# Patient Record
Sex: Female | Born: 1980 | Race: White | Hispanic: No | Marital: Married | Smoking: Light tobacco smoker
Health system: Southern US, Community
[De-identification: ages and names within clinical notes are randomized; demographics above are authoritative.]

## PROBLEM LIST (undated history)

## (undated) HISTORY — PX: PERIPHERAL VASCULAR THROMBECTOMY: CATH118306

## (undated) HISTORY — PX: TONSILLECTOMY: SUR1361

---

## 2004-04-22 ENCOUNTER — Emergency Department: Payer: Self-pay | Admitting: Emergency Medicine

## 2006-11-19 ENCOUNTER — Emergency Department (HOSPITAL_COMMUNITY): Admission: EM | Admit: 2006-11-19 | Discharge: 2006-11-19 | Payer: Self-pay | Admitting: Emergency Medicine

## 2008-05-04 ENCOUNTER — Encounter: Payer: Self-pay | Admitting: Maternal and Fetal Medicine

## 2008-05-14 ENCOUNTER — Encounter: Payer: Self-pay | Admitting: Obstetrics and Gynecology

## 2008-05-21 ENCOUNTER — Encounter: Payer: Self-pay | Admitting: Maternal & Fetal Medicine

## 2008-05-28 ENCOUNTER — Encounter: Payer: Self-pay | Admitting: Obstetrics and Gynecology

## 2008-07-09 ENCOUNTER — Encounter: Payer: Self-pay | Admitting: Maternal & Fetal Medicine

## 2008-07-30 ENCOUNTER — Encounter: Payer: Self-pay | Admitting: Maternal & Fetal Medicine

## 2008-08-03 ENCOUNTER — Encounter: Payer: Self-pay | Admitting: Obstetrics and Gynecology

## 2008-09-24 ENCOUNTER — Encounter: Payer: Self-pay | Admitting: Maternal & Fetal Medicine

## 2008-10-10 ENCOUNTER — Inpatient Hospital Stay: Payer: Self-pay

## 2008-10-22 ENCOUNTER — Encounter: Payer: Self-pay | Admitting: Maternal & Fetal Medicine

## 2013-11-06 DIAGNOSIS — F419 Anxiety disorder, unspecified: Secondary | ICD-10-CM | POA: Insufficient documentation

## 2018-07-30 ENCOUNTER — Other Ambulatory Visit: Payer: Self-pay

## 2018-07-30 ENCOUNTER — Emergency Department
Admission: EM | Admit: 2018-07-30 | Discharge: 2018-07-30 | Disposition: A | Discharge status: Home / Self Care | Payer: BLUE CROSS/BLUE SHIELD | Attending: Emergency Medicine | Admitting: Emergency Medicine

## 2018-07-30 ENCOUNTER — Emergency Department: Payer: BLUE CROSS/BLUE SHIELD

## 2018-07-30 DIAGNOSIS — S8992XA Unspecified injury of left lower leg, initial encounter: Secondary | ICD-10-CM | POA: Diagnosis not present

## 2018-07-30 DIAGNOSIS — F17200 Nicotine dependence, unspecified, uncomplicated: Secondary | ICD-10-CM | POA: Diagnosis not present

## 2018-07-30 DIAGNOSIS — Z79899 Other long term (current) drug therapy: Secondary | ICD-10-CM | POA: Diagnosis not present

## 2018-07-30 DIAGNOSIS — Y929 Unspecified place or not applicable: Secondary | ICD-10-CM | POA: Diagnosis not present

## 2018-07-30 DIAGNOSIS — X501XXA Overexertion from prolonged static or awkward postures, initial encounter: Secondary | ICD-10-CM | POA: Insufficient documentation

## 2018-07-30 DIAGNOSIS — Y9301 Activity, walking, marching and hiking: Secondary | ICD-10-CM | POA: Diagnosis not present

## 2018-07-30 DIAGNOSIS — Y999 Unspecified external cause status: Secondary | ICD-10-CM | POA: Insufficient documentation

## 2018-07-30 MED ORDER — KETOROLAC TROMETHAMINE 30 MG/ML IJ SOLN
30.0000 mg | Freq: Once | INTRAMUSCULAR | Status: AC
  Administered 2018-07-30: 11:00:00 30 mg via INTRAMUSCULAR
  Filled 2018-07-30: qty 1

## 2018-07-30 MED ORDER — IBUPROFEN 400 MG PO TABS: 400.0000 mg | ORAL_TABLET | Freq: Four times a day (QID) | ORAL | 0 refills | Status: AC | PRN

## 2018-07-30 NOTE — Discharge Instructions (Addendum)
Your x-ray does not show any fractures of your knee.  Please keep your knee Ace wrap or wear a knee brace that you can find at [facility] or [facility].  Use crutches and limit amount of pressure you put on your knee for a couple of days.  Ice and elevate knee today.  You can take ibuprofen for inflammation.  Follow-up with primary care or orthopedics for reevaluation.

## 2018-07-30 NOTE — ED Provider Notes (Signed)
[facility] Emergency Department Provider Note  ____________________________________________  Time seen: Approximately 10:40 AM  I have reviewed the triage vital signs and the nursing notes.   HISTORY  Chief Complaint Knee Pain    HPI Brooke Wright is a 38 y.o. female that presents to the emergency department for evaluation of left knee pain after injury last night.  Patient states that she twisted her ankle from slipping on a puddle of water.  She did not fall.  She did not hear a pop.  Pain is to both sides of her knee.  No additional injuries.  She took ibuprofen last night for pain but nothing today.   History reviewed. No pertinent past medical history.  There are no active problems to display for this patient.   Past Surgical History:  Procedure Laterality Date  . PERIPHERAL VASCULAR THROMBECTOMY    . TONSILLECTOMY      Prior to Admission medications   Medication Sig Start Date End Date Taking? Authorizing Provider  ALPRAZolam (XANAX) 0.25 MG tablet Take 0.25 mg by mouth 2 (two) times daily as needed for anxiety.   Yes [provider]  citalopram (CELEXA) 20 MG tablet Take 20 mg by mouth daily.   Yes [provider]  mirtazapine (REMERON) 30 MG tablet Take 30 mg by mouth at bedtime.   Yes [provider]  ibuprofen (ADVIL) 400 MG tablet Take 1 tablet (400 mg total) by mouth every 6 (six) hours as needed. 07/30/18   Enid Derry, PA-C    Allergies Penicillins  No family history on file.  Social History Social History   Tobacco Use  . Smoking status: Current Every Day Smoker  . Smokeless tobacco: Never Used  Substance Use Topics  . Alcohol use: Not Currently  . Drug use: Not Currently     Review of Systems  Cardiovascular: No chest pain. Respiratory: No SOB. Gastrointestinal:  No nausea, no vomiting.  Musculoskeletal: Positive for knee pain.   Skin: Negative for rash, abrasions, lacerations,  ecchymosis. Neurological: Negative for numbness or tingling   ____________________________________________   PHYSICAL EXAM:  VITAL SIGNS: ED Triage Vitals  Enc Vitals Group     BP 07/30/18 0938 134/82     Pulse Rate 07/30/18 0938 79     Resp 07/30/18 0938 17     Temp 07/30/18 0938 98.3 F (36.8 C)     Temp Source 07/30/18 0938 Oral     SpO2 07/30/18 0938 99 %     Weight 07/30/18 0939 180 lb (81.6 kg)     Height 07/30/18 0939 5\' 6"  (1.676 m)     Head Circumference --      Peak Flow --      Pain Score 07/30/18 0938 8     Pain Loc --      Pain Edu? --      Excl. in GC? --      Constitutional: Alert and oriented. Well appearing and in no acute distress. Eyes: Conjunctivae are normal. PERRL. EOMI. Head: Atraumatic. ENT:      Ears:      Nose: No congestion/rhinnorhea.      Mouth/Throat: Mucous membranes are moist.  Neck: No stridor.  Cardiovascular: Normal rate, regular rhythm.  Good peripheral circulation. Respiratory: Normal respiratory effort without tachypnea or retractions. Lungs CTAB. Good air entry to the bases with no decreased or absent breath sounds. Musculoskeletal: Full range of motion to all extremities. No gross deformities appreciated. No tenderness to palpation. No effusion  noted. Negative anterior drawer, posterior drawer, valgus, varus, mcMurray, patella apprehension, apley grind. Neurologic:  Normal speech and language. No gross focal neurologic deficits are appreciated.  Skin:  Skin is warm, dry and intact. No rash noted. Psychiatric: Mood and affect are normal. Speech and behavior are normal. Patient exhibits appropriate insight and judgement.   ____________________________________________   LABS (all labs ordered are listed, but only abnormal results are displayed)  Labs Reviewed - No data to display ____________________________________________  EKG   ____________________________________________  RADIOLOGY Lexine BatonI, Kailan Laws, personally viewed  and evaluated these images (plain radiographs) as part of my medical decision making, as well as reviewing the written report by the radiologist.  Dg Knee Complete 4 Views Left  Result Date: 07/30/2018 CLINICAL DATA:  38 year old female status post fall last night. EXAM: LEFT KNEE - COMPLETE 4+ VIEW COMPARISON:  None. FINDINGS: Bone mineralization is within normal limits. No evidence of fracture, dislocation, or joint effusion. Mild medial compartment spurring and subchondral sclerosis. No discrete soft tissue injury. IMPRESSION: Mild degenerative changes. No acute fracture or dislocation identified about the left knee. Electronically Signed   By: Odessa FlemingH  Hall M.D.   On: 07/30/2018 10:05    ____________________________________________    PROCEDURES  Procedure(s) performed:    Procedures    Medications  ketorolac (TORADOL) 30 MG/ML injection 30 mg (30 mg Intramuscular Given 07/30/18 1055)     ____________________________________________   INITIAL IMPRESSION / ASSESSMENT AND PLAN / ED COURSE  Pertinent labs & imaging results that were available during my care of the patient were reviewed by me and considered in my medical decision making (see chart for details).  Review of the [place] CSRS was performed in accordance of the [facility] prior to dispensing any controlled drugs.   Patient's diagnosis is consistent with knee injury.  Vital signs and exam are reassuring.  X-ray negative for acute bony abnormalities.  Knee was Ace wrapped.  Crutches were given.  Patient was given IM Toradol for pain.  Patient will be discharged home with prescriptions for Motrin. Patient is to follow up with primary care as directed. Patient is given ED precautions to return to the ED for any worsening or new symptoms.     ____________________________________________  FINAL CLINICAL IMPRESSION(S) / ED DIAGNOSES  Final diagnoses:  Injury of left knee, initial encounter      NEW MEDICATIONS STARTED DURING THIS  VISIT:  ED Discharge Orders         Ordered    ibuprofen (ADVIL) 400 MG tablet  Every 6 hours PRN     07/30/18 1115              This chart was dictated using voice recognition software/Dragon. Despite best efforts to proofread, errors can occur which can change the meaning. Any change was purely unintentional.    Enid DerryWagner, Delta Deshmukh, PA-C 07/30/18 1558    Minna AntisPaduchowski, Kevin, MD 07/31/18 [phone]

## 2018-07-30 NOTE — ED Triage Notes (Signed)
Pt states she slipped and fell last night injuring her left knee.

## 2018-08-04 ENCOUNTER — Emergency Department: Payer: BLUE CROSS/BLUE SHIELD

## 2018-08-04 ENCOUNTER — Encounter: Payer: Self-pay | Admitting: Emergency Medicine

## 2018-08-04 ENCOUNTER — Emergency Department
Admission: EM | Admit: 2018-08-04 | Discharge: 2018-08-04 | Disposition: A | Discharge status: Home / Self Care | Payer: BLUE CROSS/BLUE SHIELD | Attending: Emergency Medicine | Admitting: Emergency Medicine

## 2018-08-04 ENCOUNTER — Other Ambulatory Visit: Payer: Self-pay

## 2018-08-04 DIAGNOSIS — Z79899 Other long term (current) drug therapy: Secondary | ICD-10-CM | POA: Diagnosis not present

## 2018-08-04 DIAGNOSIS — M25561 Pain in right knee: Secondary | ICD-10-CM | POA: Diagnosis present

## 2018-08-04 DIAGNOSIS — W010XXD Fall on same level from slipping, tripping and stumbling without subsequent striking against object, subsequent encounter: Secondary | ICD-10-CM | POA: Diagnosis not present

## 2018-08-04 DIAGNOSIS — S86911D Strain of unspecified muscle(s) and tendon(s) at lower leg level, right leg, subsequent encounter: Secondary | ICD-10-CM | POA: Diagnosis not present

## 2018-08-04 DIAGNOSIS — R2241 Localized swelling, mass and lump, right lower limb: Secondary | ICD-10-CM | POA: Insufficient documentation

## 2018-08-04 DIAGNOSIS — F17200 Nicotine dependence, unspecified, uncomplicated: Secondary | ICD-10-CM | POA: Insufficient documentation

## 2018-08-04 DIAGNOSIS — M7989 Other specified soft tissue disorders: Secondary | ICD-10-CM

## 2018-08-04 MED ORDER — TRAMADOL HCL 50 MG PO TABS
50.0000 mg | ORAL_TABLET | Freq: Once | ORAL | Status: AC
  Administered 2018-08-04: 50 mg via ORAL
  Filled 2018-08-04: qty 1

## 2018-08-04 NOTE — Discharge Instructions (Addendum)
Your exam and [place] are reassuring at this time. There is no evidence of a blood clot in the lower leg. You swelling was likely due to constriction from your ace bandage. Wear the knee immobilizer as needed for support. Rest with the leg elevated to reduce swelling. Take the ibuprofen as needed. Follow-up with ortho for ongoing symptoms.

## 2018-08-04 NOTE — ED Notes (Signed)
US at bedside

## 2018-08-04 NOTE — ED Triage Notes (Signed)
Pt to ED via POV stating "I need an MRI". Pt states that she was seen on Tuesday because she hurt her left knee. Pt reports that since then the pain and swelling has gotten worse. Pt is in NAD.

## 2018-08-04 NOTE — ED Notes (Signed)
PA at bedside.

## 2018-08-04 NOTE — ED Provider Notes (Signed)
[facility] Emergency Department Provider Note ____________________________________________  Time seen: 1207  I have reviewed the triage vital signs and the nursing notes.  HISTORY  Chief Complaint  Knee Pain  HPI Brooke Wright is a 38 y.o. female parents to the ED for subsequent visit following her left knee sprain.  Patient was placed in Ace bandage and given crutches to ambulate following an initial evaluation.  She had a mechanical twisting injury to the left knee when she slipped a wet floor.  Patient's x-ray was negative on the initial evaluation.  She returns today, citing some swelling from the knee to the ankle.  She denies any reinjury in the interim.  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  She denies any chest pain, shortness of breath, diaphoresis, nausea, vomiting, or dizziness.  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  She is not currently on any anticoagulation therapy.  History reviewed. No pertinent past medical history.  There are no active problems to display for this patient.   Past Surgical History:  Procedure Laterality Date  . PERIPHERAL VASCULAR THROMBECTOMY    . TONSILLECTOMY      Prior to Admission medications   Medication Sig Start Date End Date Taking? Authorizing Provider  ALPRAZolam (XANAX) 0.25 MG tablet Take 0.25 mg by mouth 2 (two) times daily as needed for anxiety.    [provider]  citalopram (CELEXA) 20 MG tablet Take 20 mg by mouth daily.    [provider]  ibuprofen (ADVIL) 400 MG tablet Take 1 tablet (400 mg total) by mouth every 6 (six) hours as needed. 07/30/18   Enid Derry, PA-C  mirtazapine (REMERON) 30 MG tablet Take 30 mg by mouth at bedtime.    [provider]    Allergies Penicillins  No family history on file.  Social History Social History    Tobacco Use  . Smoking status: Current Every Day Smoker  . Smokeless tobacco: Never Used  Substance Use Topics  . Alcohol use: Not Currently  . Drug use: Not Currently    Review of Systems  Constitutional: Negative for fever. Eyes: Negative for visual changes. ENT: Negative for sore throat. Cardiovascular: Negative for chest pain. Respiratory: Negative for shortness of breath. Gastrointestinal: Negative for abdominal pain, vomiting and diarrhea. Genitourinary: Negative for dysuria. Musculoskeletal: Negative for back pain.  Left leg pain as above. Skin: Negative for rash. Neurological: Negative for headaches, focal weakness or numbness. ____________________________________________  PHYSICAL EXAM:  VITAL SIGNS: ED Triage Vitals  Enc Vitals Group     BP 08/04/18 1115 113/61     Pulse Rate 08/04/18 1115 94     Resp 08/04/18 1115 16     Temp 08/04/18 1115 97.6 F (36.4 C)     Temp Source 08/04/18 1115 Oral     SpO2 08/04/18 1115 97 %     Weight --      Height --      Head Circumference --      Peak Flow --      Pain Score 08/04/18 1114 9     Pain Loc --      Pain Edu? --      Excl. in GC? --     Constitutional: Alert and oriented. Well appearing and in no distress. Head: Normocephalic and atraumatic. Eyes: Conjunctivae are normal. Normal extraocular movements  Cardiovascular: Normal rate, regular rhythm. Normal distal pulses and cap refill distally. Respiratory: Normal respiratory effort.  Musculoskeletal: left knee no obvious deformity, dislocation, or joint effusion.  Patient with normal flexion extension range of the left knee without crepitus.  No valgus or varus joint stress is elicited.  No popliteal space fullness or Achilles tenderness distally.  Patient is noted to have some 1+ pitting edema from the proximal shin to the ankle.  No palpable cords or muscle defect in the gastroc musculature.  Ankle exam is benign distally.  Nontender with normal range of motion  in all extremities.  Neurologic:  Normal gross sensation. Normal speech and language. No gross focal neurologic deficits are appreciated. Skin:  Skin is warm, dry and intact. No rash noted.  No bruising, ecchymosis, skin mottling, cool, red, or erythematous skin noted. Psychiatric: Mood and affect are normal. Patient exhibits appropriate insight and judgment. ____________________________________________   RADIOLOGY  Venous US LLE negative ____________________________________________  PROCEDURES  Procedures Ultram 50 mg PO ____________________________________________  INITIAL IMPRESSION / ASSESSMENT AND PLAN / ED COURSE  Jalene MulletCourtney Bonnet was evaluated in Emergency Department on 08/06/2018 for the symptoms described in the history of present illness. She was evaluated in the context of the global COVID-19 pandemic, which necessitated consideration that the patient might be at risk for infection with the SARS-CoV-2 virus that causes COVID-19. Institutional protocols and algorithms that pertain to the evaluation of patients at risk for COVID-19 are in a state of rapid change based on information released by regulatory bodies including the CDC and federal and state organizations. These policies and algorithms were followed during the patient's care in the ED.  Patient with ED evaluation of swelling to the left leg following a knee sprain.  Patient had been wearing an Ace bandage around the knee joint while working, which likely caused some dependent edema to the lower extremity.  Her DVT study is negative at this time and reassuring for any acute occlusive process.  Symptoms likely represent some dependent edema secondary to constriction with Ace bandage.  Patient will be discharged with a knee immobilizer and instructions to bear weight as tolerated.  She is advised to rest, ice, and elevate the knee for comfort.  Again referred to orthopedics for further evaluation management of her underlying  knee sprain.   _______________________  FINAL CLINICAL IMPRESSION(S) / ED DIAGNOSES  Final diagnoses:  Strain of right knee, subsequent encounter  Swelling of lower leg      Lissa HoardMenshew, Marybeth Dandy V Bacon, PA-C 08/06/18 0758    Dionne BucySiadecki, Sebastian, MD 08/11/18 [phone]

## 2020-01-13 ENCOUNTER — Other Ambulatory Visit: Payer: Self-pay

## 2020-01-13 ENCOUNTER — Other Ambulatory Visit (HOSPITAL_COMMUNITY)
Admission: RE | Admit: 2020-01-13 | Discharge: 2020-01-13 | Disposition: A | Discharge status: Home / Self Care | Payer: BLUE CROSS/BLUE SHIELD | Source: Ambulatory Visit | Attending: Advanced Practice Midwife | Admitting: Advanced Practice Midwife

## 2020-01-13 ENCOUNTER — Encounter: Payer: Self-pay | Admitting: Advanced Practice Midwife

## 2020-01-13 ENCOUNTER — Ambulatory Visit (INDEPENDENT_AMBULATORY_CARE_PROVIDER_SITE_OTHER): Payer: 59 | Admitting: Advanced Practice Midwife

## 2020-01-13 VITALS — BP 130/78 | Ht 66.0 in | Wt 170.0 lb

## 2020-01-13 DIAGNOSIS — Z01419 Encounter for gynecological examination (general) (routine) without abnormal findings: Secondary | ICD-10-CM | POA: Insufficient documentation

## 2020-01-13 DIAGNOSIS — Z124 Encounter for screening for malignant neoplasm of cervix: Secondary | ICD-10-CM | POA: Diagnosis not present

## 2020-01-13 DIAGNOSIS — N912 Amenorrhea, unspecified: Secondary | ICD-10-CM

## 2020-01-13 LAB — POCT URINE PREGNANCY: Preg Test, Ur: NEGATIVE

## 2020-01-13 NOTE — Progress Notes (Signed)
Gynecology Annual Exam   PCP: Dorothey Baseman, MD  Chief Complaint:  Chief Complaint  Patient presents with  . Gynecologic Exam    Annual exam, period stopped in August. date unknown     History of Present Illness: Patient is a 39 y.o. [id number] presents for annual exam. The patient has complaint today of no period since the beginning of August. She admits an increase in stress with the death of her mother in law at the end of August and other family stressors. We discussed peri-menopause and the role stress can play in reproductive hormone function. Pregnancy test today is negative.    LMP: Patient's last menstrual period was 10/20/2019 (approximate). Usual Average Interval: regular, 30 days Duration of flow: 4 days Heavy Menses: no Clots: no Intermenstrual Bleeding: no Postcoital Bleeding: no Dysmenorrhea: no  The patient is sexually active. She currently uses vasectomy for contraception. She denies dyspareunia.  The patient does perform self breast exams.  There is no notable family history of breast or ovarian cancer in her family.  The patient wears seatbelts: yes.   The patient has regular exercise: she walks and occasionally hikes. She likes fruits and vegetables. She also eats sugary, processed and fried foods. She gets about 8 hours of sleep per night.  She smokes 1-2 cigarettes per day for the past 1.5 years. She has an on again, off again smoking history.  The patient denies current symptoms of depression.  She has good control of moods with medication and therapy.  Review of Systems: Review of Systems  Constitutional: Negative for chills and fever.  HENT: Negative for congestion, ear discharge, ear pain, hearing loss, sinus pain and sore throat.   Eyes: Negative for blurred vision and double vision.  Respiratory: Negative for cough, shortness of breath and wheezing.   Cardiovascular: Negative for chest pain, palpitations and leg swelling.  Gastrointestinal:  Negative for abdominal pain, blood in stool, constipation, diarrhea, heartburn, melena, nausea and vomiting.  Genitourinary: Negative for dysuria, flank pain, frequency, hematuria and urgency.  Musculoskeletal: Positive for joint pain. Negative for back pain and myalgias.  Skin: Negative for itching and rash.  Neurological: Negative for dizziness, tingling, tremors, sensory change, speech change, focal weakness, seizures, loss of consciousness, weakness and headaches.  Endo/Heme/Allergies: Negative for environmental allergies. Does not bruise/bleed easily.  Psychiatric/Behavioral: Negative for depression, hallucinations, memory loss, substance abuse and suicidal ideas. The patient is not nervous/anxious and does not have insomnia.        Positive for anxiety and depression  Breast: positive for breast tenderness   Past Medical History:  Patient Active Problem List   Diagnosis Date Noted  . Anxiety 11/06/2013    Past Surgical History:  Past Surgical History:  Procedure Laterality Date  . PERIPHERAL VASCULAR THROMBECTOMY    . TONSILLECTOMY      Gynecologic History:  Patient's last menstrual period was 10/20/2019 (approximate). Contraception: vasectomy Last Pap: 4 years ago Results were: no abnormalities   Obstetric History: [id number]  Family History:  History reviewed. No pertinent family history.  Social History:  Social History   Socioeconomic History  . Marital status: Married    Spouse name: Not on file  . Number of children: Not on file  . Years of education: Not on file  . Highest education level: Not on file  Occupational History  . Not on file  Tobacco Use  . Smoking status: Light Tobacco Smoker  . Smokeless tobacco: Never Used  Vaping Use  .  Vaping Use: Never used  Substance and Sexual Activity  . Alcohol use: Yes    Comment: socially   . Drug use: Yes    Types: Marijuana  . Sexual activity: Yes    Birth control/protection: None    Comment: Husband -  sterlized   Other Topics Concern  . Not on file  Social History Narrative  . Not on file   Social Determinants of Health   Financial Resource Strain:   . Difficulty of Paying Living Expenses: Not on file  Food Insecurity:   . Worried About Programme researcher, broadcasting/film/video in the Last Year: Not on file  . Ran Out of Food in the Last Year: Not on file  Transportation Needs:   . Lack of Transportation (Medical): Not on file  . Lack of Transportation (Non-Medical): Not on file  Physical Activity:   . Days of Exercise per Week: Not on file  . Minutes of Exercise per Session: Not on file  Stress:   . Feeling of Stress : Not on file  Social Connections:   . Frequency of Communication with Friends and Family: Not on file  . Frequency of Social Gatherings with Friends and Family: Not on file  . Attends Religious Services: Not on file  . Active Member of Clubs or Organizations: Not on file  . Attends Banker Meetings: Not on file  . Marital Status: Not on file  Intimate Partner Violence:   . Fear of Current or Ex-Partner: Not on file  . Emotionally Abused: Not on file  . Physically Abused: Not on file  . Sexually Abused: Not on file    Allergies:  Allergies  Allergen Reactions  . Penicillins Anaphylaxis    Medications: Prior to Admission medications   Medication Sig Start Date End Date Taking? Authorizing Provider  ALPRAZolam (XANAX) 0.25 MG tablet Take 0.25 mg by mouth 2 (two) times daily as needed for anxiety.   Yes [provider]  citalopram (CELEXA) 20 MG tablet Take 20 mg by mouth daily.   Yes [provider]  ibuprofen (ADVIL) 400 MG tablet Take 1 tablet (400 mg total) by mouth every 6 (six) hours as needed. 07/30/18  Yes Enid Derry, PA-C  mirtazapine (REMERON) 30 MG tablet Take 30 mg by mouth at bedtime.   Yes [provider]  B Complex Vitamins (VITAMIN B COMPLEX 100 IJ) Take by mouth.    [provider]  Calcium  Carb-Cholecalciferol (CALCIUM CARBONATE-VITAMIN D3) 600-400 MG-UNIT TABS Take 2 tablets by mouth daily.    [provider]    Physical Exam Vitals: Blood pressure 130/78, height 5\' 6"  (1.676 m), weight 170 lb (77.1 kg), last menstrual period 10/20/2019.  General: NAD HEENT: normocephalic, anicteric Thyroid: no enlargement, no palpable nodules Pulmonary: No increased work of breathing, CTAB Cardiovascular: RRR, distal pulses 2+ Breast: Breast symmetrical, mild tenderness to palpation, no palpable nodules or masses, no skin or nipple retraction present, no nipple discharge.  No axillary or supraclavicular lymphadenopathy. Abdomen: NABS, soft, non-tender, non-distended.  Umbilicus without lesions.  No hepatomegaly, splenomegaly or masses palpable. No evidence of hernia  Genitourinary:  External: Normal external female genitalia.  Normal urethral meatus, normal Bartholin's and Skene's glands.    Vagina: Normal vaginal mucosa, no evidence of prolapse.    Cervix: Grossly normal in appearance, no bleeding, no CMT  Uterus: deferred for no concerns    Adnexa: deferred for no concerns  Rectal: deferred  Lymphatic: no evidence of inguinal lymphadenopathy Extremities:  no edema, erythema, or tenderness Neurologic: Grossly intact Psychiatric: mood appropriate, affect full   Assessment: 39 y.o. G2P1102 routine annual exam  Plan: Problem List Items Addressed This Visit    None    Visit Diagnoses    Well woman exam with routine gynecological exam    -  Primary   Relevant Orders   Cytology - PAP   Amenorrhea       Relevant Orders   POCT urine pregnancy (Completed)   Cervical cancer screening       Relevant Orders   Cytology - PAP      1) STI screening  was offered and declined  2)  ASCCP guidelines and rationale discussed.  Patient opts for every 5 years screening interval  3) Contraception - the patient is currently using  vasectomy.  She is happy with her current form of  contraception and plans to continue  4) Routine healthcare maintenance including cholesterol, diabetes screening discussed managed by PCP  5) Return in about 1 year (around 01/12/2021) for annual established gyn.   Tresea Mall, CNM [facility] 01/13/2020, 2:12 PM

## 2020-01-16 LAB — CYTOLOGY - PAP
Comment: NEGATIVE
Diagnosis: NEGATIVE
High risk HPV: NEGATIVE

## 2020-07-02 ENCOUNTER — Emergency Department
Admission: EM | Admit: 2020-07-02 | Discharge: 2020-07-02 | Disposition: A | Discharge status: Home / Self Care | Payer: 59 | Attending: Emergency Medicine | Admitting: Emergency Medicine

## 2020-07-02 ENCOUNTER — Other Ambulatory Visit: Payer: Self-pay

## 2020-07-02 ENCOUNTER — Encounter: Payer: Self-pay | Admitting: Emergency Medicine

## 2020-07-02 DIAGNOSIS — T50Z95A Adverse effect of other vaccines and biological substances, initial encounter: Secondary | ICD-10-CM | POA: Diagnosis not present

## 2020-07-02 DIAGNOSIS — R591 Generalized enlarged lymph nodes: Secondary | ICD-10-CM

## 2020-07-02 DIAGNOSIS — F17200 Nicotine dependence, unspecified, uncomplicated: Secondary | ICD-10-CM | POA: Diagnosis not present

## 2020-07-02 DIAGNOSIS — R590 Localized enlarged lymph nodes: Secondary | ICD-10-CM | POA: Insufficient documentation

## 2020-07-02 DIAGNOSIS — R221 Localized swelling, mass and lump, neck: Secondary | ICD-10-CM | POA: Diagnosis present

## 2020-07-02 MED ORDER — PREDNISONE 20 MG PO TABS
40.0000 mg | ORAL_TABLET | Freq: Once | ORAL | Status: AC
  Administered 2020-07-02: 40 mg via ORAL
  Filled 2020-07-02: qty 2

## 2020-07-02 MED ORDER — DIPHENHYDRAMINE HCL 25 MG PO TABS: 25.0000 mg | ORAL_TABLET | Freq: Three times a day (TID) | ORAL | 0 refills | Status: DC

## 2020-07-02 MED ORDER — PREDNISONE 20 MG PO TABS: 40.0000 mg | ORAL_TABLET | Freq: Every day | ORAL | 0 refills | Status: AC

## 2020-07-02 MED ORDER — HYDROXYZINE HCL 25 MG PO TABS
25.0000 mg | ORAL_TABLET | Freq: Once | ORAL | Status: AC
  Administered 2020-07-02: 25 mg via ORAL
  Filled 2020-07-02: qty 1

## 2020-07-02 NOTE — Discharge Instructions (Signed)
I'd recommend against receiving a second vaccine for COVID-19  Take the steroids as prescribed. The benadryl is for swelling/rash/itching. I'd recommend taking it at least once every 8 hours for 24 hours, then as needed.

## 2020-07-02 NOTE — ED Provider Notes (Signed)
[facility] Emergency Department Provider Note  ____________________________________________   Event Date/Time   First MD Initiated Contact with Patient 07/02/20 [phone]     (approximate)  I have reviewed the triage vital signs and the nursing notes.   HISTORY  Chief Complaint No chief complaint on file.    HPI Brooke Wright is a 40 y.o. female here with neck swelling after receiving COVID-vaccine yesterday.  The patient states she was in her usual state of health until she underwent COVID vaccination yesterday.  This was her first vaccine.  She states that later yesterday afternoon, she began to develop a swelling sensation in her right lower cervical neck area.  She felt a pop.  She states that the swelling has persisted and she has now developed some swelling along her left base of the neck as well.  Patient denies any difficulty speaking or swallowing.  She does feel some subjective fullness denies any other new medications or exposures.  No other complaints.  No hoarseness.  No rash        History reviewed. No pertinent past medical history.  Patient Active Problem List   Diagnosis Date Noted  . Anxiety 11/06/2013    Past Surgical History:  Procedure Laterality Date  . PERIPHERAL VASCULAR THROMBECTOMY    . TONSILLECTOMY      Prior to Admission medications   Medication Sig Start Date End Date Taking? Authorizing Provider  diphenhydrAMINE (BENADRYL) 25 MG tablet Take 1 tablet (25 mg total) by mouth in the morning, at noon, and at bedtime for 3 days. 07/02/20 07/05/20 Yes Shaune Pollack, MD  predniSONE (DELTASONE) 20 MG tablet Take 2 tablets (40 mg total) by mouth daily for 3 days. 07/02/20 07/05/20 Yes Shaune Pollack, MD  ALPRAZolam Prudy Feeler) 0.25 MG tablet Take 0.25 mg by mouth 2 (two) times daily as needed for anxiety.    [provider]  B Complex Vitamins (VITAMIN B COMPLEX 100 IJ) Take by mouth.    [provider]  Calcium  Carb-Cholecalciferol (CALCIUM CARBONATE-VITAMIN D3) 600-400 MG-UNIT TABS Take 2 tablets by mouth daily.    [provider]  citalopram (CELEXA) 20 MG tablet Take 20 mg by mouth daily.    [provider]  ibuprofen (ADVIL) 400 MG tablet Take 1 tablet (400 mg total) by mouth every 6 (six) hours as needed. 07/30/18   Enid Derry, PA-C  mirtazapine (REMERON) 30 MG tablet Take 30 mg by mouth at bedtime.    [provider]    Allergies Penicillins  History reviewed. No pertinent family history.  Social History Social History   Tobacco Use  . Smoking status: Light Tobacco Smoker  . Smokeless tobacco: Never Used  Vaping Use  . Vaping Use: Never used  Substance Use Topics  . Alcohol use: Yes    Comment: socially   . Drug use: Yes    Types: Marijuana    Review of Systems  Review of Systems  Constitutional: Negative for chills and fever.  HENT: Negative for sore throat.   Respiratory: Negative for shortness of breath.   Cardiovascular: Negative for chest pain.  Gastrointestinal: Negative for abdominal pain.  Genitourinary: Negative for flank pain.  Musculoskeletal: Positive for neck pain and neck stiffness.  Skin: Negative for rash and wound.  Allergic/Immunologic: Negative for immunocompromised state.  Neurological: Negative for weakness and numbness.  Hematological: Does not bruise/bleed easily.  All other systems reviewed and are negative.    ____________________________________________  PHYSICAL EXAM:  VITAL SIGNS: ED Triage Vitals  Enc Vitals Group     BP 07/02/20 0829 135/67     Pulse Rate 07/02/20 0829 83     Resp 07/02/20 0829 17     Temp 07/02/20 0829 98.1 F (36.7 C)     Temp Source 07/02/20 0829 Oral     SpO2 07/02/20 0829 98 %     Weight 07/02/20 0824 280 lb (127 kg)     Height 07/02/20 0824 5\' 5"  (1.651 m)     Head Circumference --      Peak Flow --      Pain Score 07/02/20 0824 0     Pain Loc --      Pain Edu? --       Excl. in GC? --      Physical Exam Vitals and nursing note reviewed.  Constitutional:      General: She is not in acute distress.    Appearance: She is well-developed.  HENT:     Head: Normocephalic and atraumatic.  Eyes:     Conjunctiva/sclera: Conjunctivae normal.  Neck:     Comments: Mild supraclavicular lymphadenopathy on the right and anterior cervical lymphadenopathy.  No appreciable swelling on the left.  No erythema.  No streaking.  Phonation normal and airway is patent without stridor. Cardiovascular:     Rate and Rhythm: Normal rate and regular rhythm.     Heart sounds: Normal heart sounds. No murmur heard. No friction rub.  Pulmonary:     Effort: Pulmonary effort is normal. No respiratory distress.     Breath sounds: Normal breath sounds. No wheezing or rales.  Abdominal:     General: There is no distension.     Palpations: Abdomen is soft.     Tenderness: There is no abdominal tenderness.  Musculoskeletal:     Cervical back: Neck supple.  Skin:    General: Skin is warm.     Capillary Refill: Capillary refill takes less than 2 seconds.  Neurological:     Mental Status: She is alert and oriented to person, place, and time.     Motor: No abnormal muscle tone.       ____________________________________________   LABS (all labs ordered are listed, but only abnormal results are displayed)  Labs Reviewed - No data to display  ____________________________________________  EKG: None ________________________________________  RADIOLOGY All imaging, including plain films, CT scans, and ultrasounds, independently reviewed by me, and interpretations confirmed via formal radiology reads.  ED MD interpretation:     Official radiology report(s): No results found.  ____________________________________________  PROCEDURES   Procedure(s) performed (including Critical Care):  Procedures  ____________________________________________  INITIAL IMPRESSION /  MDM / ASSESSMENT AND PLAN / ED COURSE  As part of my medical decision making, I reviewed the following data within the electronic MEDICAL RECORD NUMBER Nursing notes reviewed and incorporated, Old chart reviewed, Notes from prior ED visits, and [place] Controlled Substance Database       *Abbiegail Landgren was evaluated in Emergency Department on 07/02/2020 for the symptoms described in the history of present illness. She was evaluated in the context of the global COVID-19 pandemic, which necessitated consideration that the patient might be at risk for infection with the SARS-CoV-2 virus that causes COVID-19. Institutional protocols and algorithms that pertain to the evaluation of patients at risk for COVID-19 are in a state of rapid change based on information released by regulatory bodies including the CDC and federal and state organizations. These policies and  algorithms were followed during the patient's care in the ED.  Some ED evaluations and interventions may be delayed as a result of limited staffing during the pandemic.*     Medical Decision Making: 39 year old female here with neck swelling after receiving COVID vaccination.  Clinically, I suspect likely reactive lymphadenopathy secondary to COVID vaccination, though she does have history of allergic reaction and endorses some pruritus and subjective throat swelling.  She has normal phonation, no stridor, or signs of airway compromise.  She does endorse some mild pruritus.  While I suspect this is mostly due to appropriate immune response to the vaccine, given her history of allergies, I am hesitant for her to receive a second dose.  Will give a brief course of prednisone and Benadryl, good return precautions, and discharged with work note provided.  No other signs of alternative etiology such as cellulitis, RPA, or more emergent neck pathology.  No other signs of allergic reaction or anaphylaxis.  ____________________________________________  FINAL  CLINICAL IMPRESSION(S) / ED DIAGNOSES  Final diagnoses:  Lymphadenopathy  Adverse effect of vaccine, initial encounter     MEDICATIONS GIVEN DURING THIS VISIT:  Medications  predniSONE (DELTASONE) tablet 40 mg (40 mg Oral Given 07/02/20 0908)  hydrOXYzine (ATARAX/VISTARIL) tablet 25 mg (25 mg Oral Given 07/02/20 0908)     ED Discharge Orders         Ordered    predniSONE (DELTASONE) 20 MG tablet  Daily        07/02/20 0937    diphenhydrAMINE (BENADRYL) 25 MG tablet  3 times daily        07/02/20 0865           Note:  This document was prepared using Dragon voice recognition software and may include unintentional dictation errors.   Shaune Pollack, MD 07/02/20 [phone]

## 2020-07-02 NOTE — ED Triage Notes (Signed)
Pt to ED via POV with c/o neck swelling and stiffness that started last night, pt states started on the R side then moved to the L. Pt states feels like tightness. Pt states had 1st dose of Covid vaccine yesterday on the R side. Pt denies feeling any difficulty breathing or difficulty swallowing, states just feels like her neck is  swollen and tight.

## 2020-07-10 ENCOUNTER — Other Ambulatory Visit: Payer: Self-pay

## 2020-07-10 ENCOUNTER — Emergency Department: Payer: 59

## 2020-07-10 ENCOUNTER — Emergency Department
Admission: EM | Admit: 2020-07-10 | Discharge: 2020-07-10 | Disposition: A | Discharge status: Home / Self Care | Payer: 59 | Attending: Emergency Medicine | Admitting: Emergency Medicine

## 2020-07-10 DIAGNOSIS — R1032 Left lower quadrant pain: Secondary | ICD-10-CM | POA: Insufficient documentation

## 2020-07-10 DIAGNOSIS — F17200 Nicotine dependence, unspecified, uncomplicated: Secondary | ICD-10-CM | POA: Diagnosis not present

## 2020-07-10 DIAGNOSIS — R1030 Lower abdominal pain, unspecified: Secondary | ICD-10-CM | POA: Diagnosis present

## 2020-07-10 DIAGNOSIS — R1031 Right lower quadrant pain: Secondary | ICD-10-CM | POA: Diagnosis not present

## 2020-07-10 DIAGNOSIS — D72829 Elevated white blood cell count, unspecified: Secondary | ICD-10-CM | POA: Diagnosis not present

## 2020-07-10 LAB — URINALYSIS, COMPLETE (UACMP) WITH MICROSCOPIC
Bacteria, UA: NONE SEEN
Bilirubin Urine: NEGATIVE
Glucose, UA: NEGATIVE mg/dL
Hgb urine dipstick: NEGATIVE
Ketones, ur: NEGATIVE mg/dL
Leukocytes,Ua: NEGATIVE
Nitrite: NEGATIVE
Protein, ur: NEGATIVE mg/dL
Specific Gravity, Urine: 1.016 (ref 1.005–1.030)
pH: 7 (ref 5.0–8.0)

## 2020-07-10 LAB — COMPREHENSIVE METABOLIC PANEL
ALT: 20 U/L (ref 0–44)
AST: 19 U/L (ref 15–41)
Albumin: 4.1 g/dL (ref 3.5–5.0)
Alkaline Phosphatase: 58 U/L (ref 38–126)
Anion gap: 11 (ref 5–15)
BUN: 9 mg/dL (ref 6–20)
CO2: 24 mmol/L (ref 22–32)
Calcium: 9 mg/dL (ref 8.9–10.3)
Chloride: 100 mmol/L (ref 98–111)
Creatinine, Ser: 0.89 mg/dL (ref 0.44–1.00)
GFR, Estimated: >60 mL/min (ref >60)
Glucose, Bld: 104 mg/dL — ABNORMAL HIGH (ref 70–99)
Potassium: 3.6 mmol/L (ref 3.5–5.1)
Sodium: 135 mmol/L (ref 135–145)
Total Bilirubin: 0.6 mg/dL (ref 0.3–1.2)
Total Protein: 7.2 g/dL (ref 6.5–8.1)

## 2020-07-10 LAB — CBC
HCT: 44.2 % (ref 36.0–46.0)
Hemoglobin: 15 g/dL (ref 12.0–15.0)
MCH: 30.7 pg (ref 26.0–34.0)
MCHC: 33.9 g/dL (ref 30.0–36.0)
MCV: 90.4 fL (ref 80.0–100.0)
Platelets: 321 10*3/uL (ref 150–400)
RBC: 4.89 MIL/uL (ref 3.87–5.11)
RDW: 13 % (ref 11.5–15.5)
WBC: 11.2 10*3/uL — ABNORMAL HIGH (ref 4.0–10.5)
nRBC: 0 % (ref 0.0–0.2)

## 2020-07-10 LAB — HCG, QUANTITATIVE, PREGNANCY: hCG, Beta Chain, Quant, S: 1 m[IU]/mL (ref <5)

## 2020-07-10 LAB — LIPASE, BLOOD: Lipase: 29 U/L (ref 11–51)

## 2020-07-10 LAB — POC URINE PREG, ED: Preg Test, Ur: NEGATIVE

## 2020-07-10 MED ORDER — IOHEXOL 300 MG/ML  SOLN
100.0000 mL | Freq: Once | INTRAMUSCULAR | Status: AC | PRN
  Administered 2020-07-10: 100 mL via INTRAVENOUS

## 2020-07-10 MED ORDER — DICYCLOMINE HCL 10 MG PO CAPS: 10.0000 mg | ORAL_CAPSULE | Freq: Three times a day (TID) | ORAL | 0 refills | Status: AC | PRN

## 2020-07-10 NOTE — ED Notes (Addendum)
Pt sts abd pain and feeling of difficulty swallowing is improved. MD notified.

## 2020-07-10 NOTE — ED Triage Notes (Signed)
Pt states that she was placed on hydroxyzine and started taking in on Wednesday and woke up Thursday morning with lower abdominal pain and lower back pain- pt states her BM are also not normal since she is not going as much as she usually does- pt denies urinary symptoms

## 2020-07-10 NOTE — ED Notes (Signed)
ED Provider at bedside. 

## 2020-07-10 NOTE — ED Provider Notes (Signed)
[facility] Emergency Department Provider Note ____________________________________________   Event Date/Time   First MD Initiated Contact with Patient 07/10/20 1505     (approximate)  I have reviewed the triage vital signs and the nursing notes.   HISTORY  Chief Complaint Abdominal Pain    HPI Brooke Wright is a 40 y.o. female with PMH as noted below who presents with lower abdominal pain for the last 2 days, constant but with brief waves of sharper pain lasting a few seconds.  It is across both sides of her lower abdomen.  She has associated nausea but no vomiting.  She denies any significant constipation and states that she had a bowel movement yesterday.  She has no associated vaginal bleeding, discharge, or urinary symptoms.  The patient reports that she was on prednisone last week for an allergic reaction, subsequently had a adverse effect of Benadryl, and was given hydroxyzine which she only took once the night before the current pain started.  History reviewed. No pertinent past medical history.  Patient Active Problem List   Diagnosis Date Noted  . Anxiety 11/06/2013    Past Surgical History:  Procedure Laterality Date  . PERIPHERAL VASCULAR THROMBECTOMY    . TONSILLECTOMY      Prior to Admission medications   Medication Sig Start Date End Date Taking? Authorizing Provider  dicyclomine (BENTYL) 10 MG capsule Take 1 capsule (10 mg total) by mouth 3 (three) times daily as needed for up to 5 days (abdominal pain/cramping). 07/10/20 07/15/20 Yes Dionne Bucy, MD  ALPRAZolam Prudy Feeler) 0.25 MG tablet Take 0.25 mg by mouth 2 (two) times daily as needed for anxiety.    [provider]  B Complex Vitamins (VITAMIN B COMPLEX 100 IJ) Take by mouth.    [provider]  Calcium Carb-Cholecalciferol (CALCIUM CARBONATE-VITAMIN D3) 600-400 MG-UNIT TABS Take 2 tablets by mouth daily.    [provider]  citalopram (CELEXA) 20  MG tablet Take 20 mg by mouth daily.    [provider]  diphenhydrAMINE (BENADRYL) 25 MG tablet Take 1 tablet (25 mg total) by mouth in the morning, at noon, and at bedtime for 3 days. 07/02/20 07/05/20  Shaune Pollack, MD  ibuprofen (ADVIL) 400 MG tablet Take 1 tablet (400 mg total) by mouth every 6 (six) hours as needed. 07/30/18   Enid Derry, PA-C  mirtazapine (REMERON) 30 MG tablet Take 30 mg by mouth at bedtime.    [provider]    Allergies Penicillins  No family history on file.  Social History Social History   Tobacco Use  . Smoking status: Light Tobacco Smoker  . Smokeless tobacco: Never Used  Vaping Use  . Vaping Use: Never used  Substance Use Topics  . Alcohol use: Yes    Comment: socially   . Drug use: Yes    Types: Marijuana    Review of Systems  Constitutional: No fever. Eyes: No redness. ENT: No sore throat. Cardiovascular: Denies chest pain. Respiratory: Denies shortness of breath. Gastrointestinal: Positive for nausea.  No vomiting or diarrhea.  Genitourinary: Negative for dysuria, vaginal bleeding, discharge.  Musculoskeletal: Negative for back pain. Skin: Negative for rash. Neurological: Negative for headaches, focal weakness or numbness.   ____________________________________________   PHYSICAL EXAM:  VITAL SIGNS: ED Triage Vitals  Enc Vitals Group     BP 07/10/20 1257 125/80     Pulse Rate 07/10/20 1257 81     Resp 07/10/20 1257 18     Temp 07/10/20  1257 98.3 F (36.8 C)     Temp Source 07/10/20 1257 Oral     SpO2 07/10/20 1257 97 %     Weight 07/10/20 1258 170 lb (77.1 kg)     Height 07/10/20 1258 5\' 5"  (1.651 m)     Head Circumference --      Peak Flow --      Pain Score 07/10/20 1257 9     Pain Loc --      Pain Edu? --      Excl. in GC? --     Constitutional: Alert and oriented.  Relatively well appearing and in no acute distress. Eyes: Conjunctivae are normal.  No scleral icterus. Head:  Atraumatic. Nose: No congestion/rhinnorhea. Mouth/Throat: Mucous membranes are moist.   Neck: Normal range of motion.  Cardiovascular: Normal rate, regular rhythm.  Good peripheral circulation. Respiratory: Normal respiratory effort.  No retractions. Gastrointestinal: Soft with mild bilateral lower quadrant tenderness, somewhat worse on the right.  No distention.  Genitourinary: No flank tenderness. Musculoskeletal: Extremities warm and well perfused.  Neurologic:  Normal speech and language. No gross focal neurologic deficits are appreciated.  Skin:  Skin is warm and dry. No rash noted. Psychiatric: Mood and affect are normal. Speech and behavior are normal.  ____________________________________________   LABS (all labs ordered are listed, but only abnormal results are displayed)  Labs Reviewed  COMPREHENSIVE METABOLIC PANEL - Abnormal; Notable for the following components:      Result Value   Glucose, Bld 104 (*)    All other components within normal limits  CBC - Abnormal; Notable for the following components:   WBC 11.2 (*)    All other components within normal limits  URINALYSIS, COMPLETE (UACMP) WITH MICROSCOPIC - Abnormal; Notable for the following components:   Color, Urine YELLOW (*)    APPearance HAZY (*)    All other components within normal limits  LIPASE, BLOOD  HCG, QUANTITATIVE, PREGNANCY  POC URINE PREG, ED   ____________________________________________  EKG  ED ECG REPORT I, 07/12/20, the attending physician, personally viewed and interpreted this ECG.  Date: 07/10/2020 EKG Time: 1356 Rate: 62 Rhythm: normal sinus rhythm QRS Axis: normal Intervals: normal ST/T Wave abnormalities: normal Narrative Interpretation: no evidence of acute ischemia  ____________________________________________  RADIOLOGY  XR abdomen: No acute abnormality CT abdomen/pelvis: No acute  abnormality  ____________________________________________   PROCEDURES  Procedure(s) performed: No  Procedures  Critical Care performed: No ____________________________________________   INITIAL IMPRESSION / ASSESSMENT AND PLAN / ED COURSE  Pertinent labs & imaging results that were available during my care of the patient were reviewed by me and considered in my medical decision making (see chart for details).  40 year old female with a history of anxiety presents with lower abdominal pain over the last 2 days after taking a dose of hydroxyzine the night before and being on prednisone last week.  She denies any associated vomiting or diarrhea.  On exam, the patient is overall well-appearing.  Her vital signs are normal.  The abdomen is soft with mild bilateral lower quadrant tenderness, slightly worse on the right.  Exam is otherwise unremarkable.  Differential includes appendicitis, diverticulitis, colitis, gastritis, PUD, gastroenteritis, UTI/cystitis, medication side effect.  I have a low suspicion for SBO, volvulus.  I also have a low suspicion for acute gynecologic cause given the lack of vaginal bleeding or discharge and the bilateral location of the pain.  Initial lab work-up is unremarkable except for mild leukocytosis.  Will obtain a CT  and urinalysis for further evaluation.  ----------------------------------------- 5:55 PM on 07/10/2020 -----------------------------------------  Urinalysis and the remainder of the lab work-up are within normal limits.  CT also shows no acute abnormality.  The patient continues to appear comfortable and has not required any pain medication in the ED.  After the CT she did have some tingling and discomfort in the back of her tongue and throat for short time.  It is possible that this could be a mild allergic reaction to contrast although she does not have any hives, shortness of breath, or other acute symptoms.  The posterior oropharynx and  tongue do not appear swollen.  Since she does not tolerate Benadryl well, I discussed it with her and we decided to hold off on giving any treatment for possible allergic reaction and just watch her symptoms.  Now an hour later, the symptoms have resolved.  At this time, the patient is stable for discharge home.  I counseled her on the results of the work-up.  I gave her thorough return precautions and she expressed understanding.  I will prescribe Bentyl for symptomatic treatment and have instructed her to discontinue the hydroxyzine. ____________________________________________   FINAL CLINICAL IMPRESSION(S) / ED DIAGNOSES  Final diagnoses:  Lower abdominal pain      NEW MEDICATIONS STARTED DURING THIS VISIT:  New Prescriptions   DICYCLOMINE (BENTYL) 10 MG CAPSULE    Take 1 capsule (10 mg total) by mouth 3 (three) times daily as needed for up to 5 days (abdominal pain/cramping).     Note:  This document was prepared using Dragon voice recognition software and may include unintentional dictation errors.   Dionne Bucy, MD 07/10/20 1757

## 2020-07-10 NOTE — ED Notes (Signed)
Pt c/o feeling like her tongue is numb and it is difficult to swallow. No oral swelling present on assessment. Speech clear, RR even and unlabored.  No rash. Dr Marisa Severin notified and to reeval pt. Call bell in reach and pt instructed to call for any worsening of symptoms.

## 2020-07-10 NOTE — ED Notes (Signed)
Pt returned from CT °

## 2020-07-10 NOTE — ED Triage Notes (Signed)
Emergency Medicine Provider Triage Evaluation Note  Brooke Wright , a 40 y.o. female  was evaluated in triage.  Pt complains of abdominal pain.  Review of Systems  Positive: constipation Negative: Dysuria, frequency, urgency, vaginal bleeding, vaginal discharge, vomiting  Physical Exam  BP 125/80 (BP Location: Left Arm)   Pulse 81   Temp 98.3 F (36.8 C) (Oral)   Resp 18   Ht 5\' 5"  (1.651 m)   Wt 77.1 kg   SpO2 97%   BMI 28.29 kg/m  Gen:   Awake, no distress   HEENT:  Atraumatic  Resp:  Normal effort  Cardiac:  Normal rate  MSK:   Moves extremities without difficulty  Neuro:  Speech clear   Medical Decision Making  Medically screening exam initiated at 2:04 PM.  Appropriate orders placed.  Brooke Wright was informed that the remainder of the evaluation will be completed by another provider, this initial triage assessment does not replace that evaluation, and the importance of remaining in the ED until their evaluation is complete.  Clinical Impression  constipation   Brooke Mullet, MD 07/10/20 [phone]

## 2020-07-10 NOTE — Discharge Instructions (Signed)
You may take the Bentyl as needed for crampy abdominal pain.  Discontinue the hydroxyzine.  Return to the ER for new, worsening, or persistent severe pain, fever, vomiting, weakness, vaginal bleeding, or any other new or worsening symptoms that concern you.

## 2020-07-10 NOTE — ED Notes (Signed)
Pt states that she was started on hydroxyzine two days ago; and finished taking prednisone three days ago; pani radiates to belly button and lower back; pt states this pain has not happened before and no hx of kidney stones and no urinary symptoms

## 2021-12-28 IMAGING — DX DG ABDOMEN 1V
2 series · 2 of 2 positions shown · non-contrast
Comparison: None.

CLINICAL DATA: Abdominal pain.

EXAM:
ABDOMEN - 1 VIEW

[abdomen supine (1 of 2)]
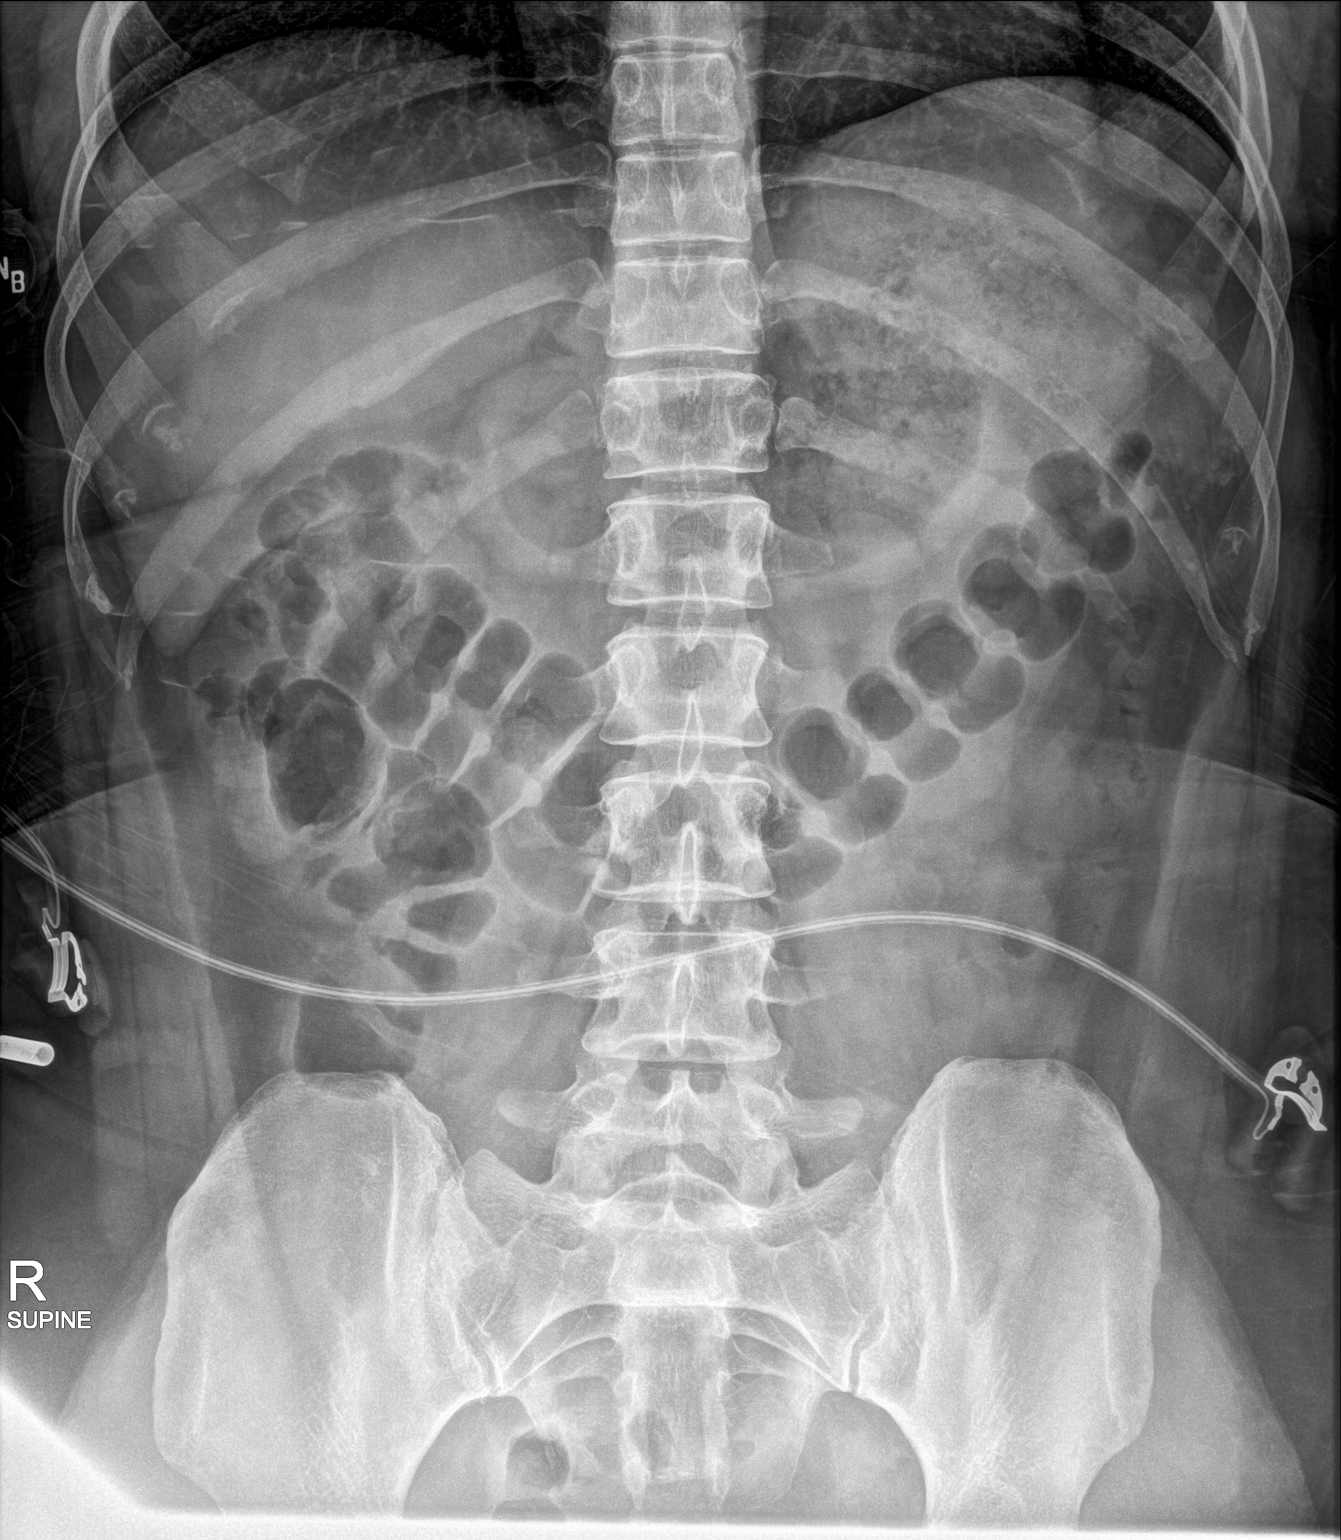

[abdomen supine (2 of 2)]
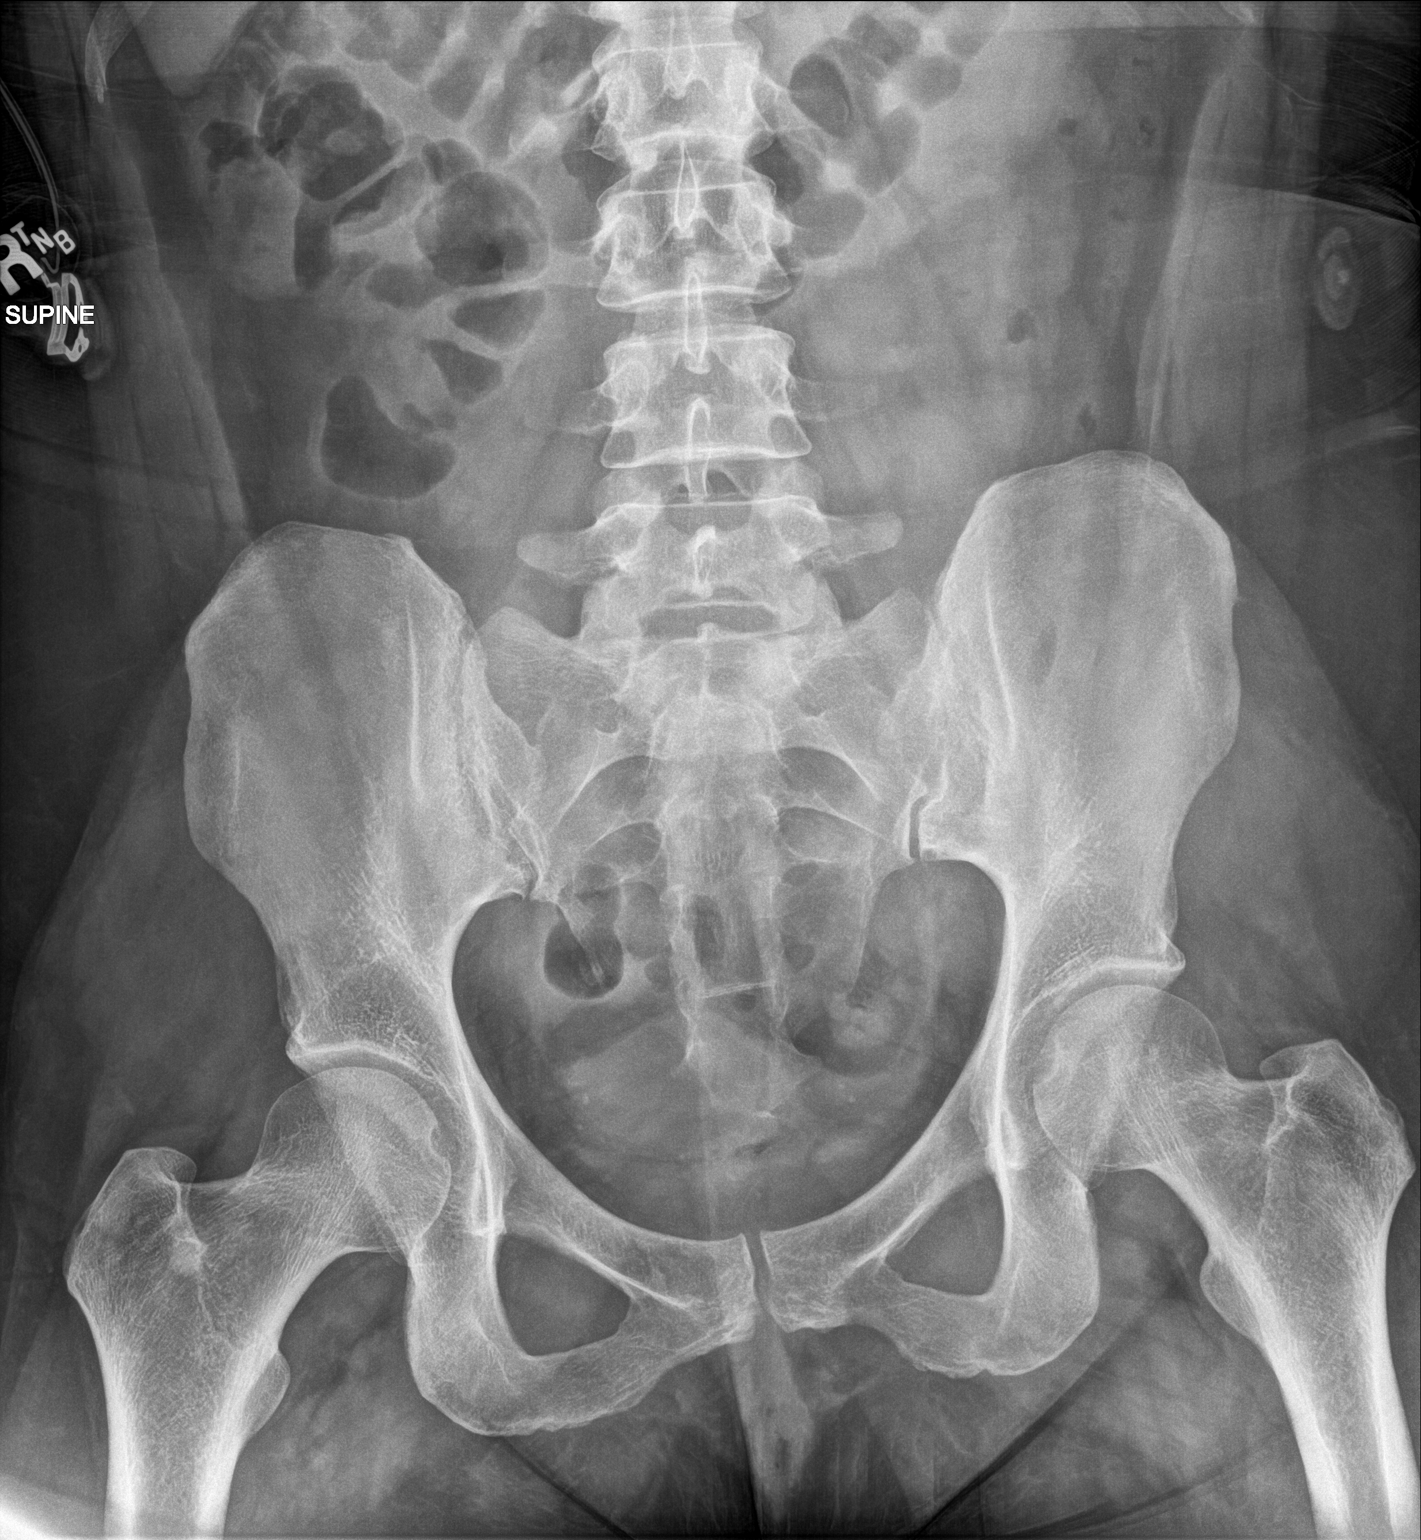

[2 of 2 positions shown; findings below may reference images not displayed]

FINDINGS: Limited views of the lung bases are normal. No free air, portal
venous gas, or pneumatosis seen on supine imaging. No evidence of
bowel obstruction. Both kidneys are obscured by bowel contents but
no renal stones identified. Tiny calcifications in the pelvis are
not specific but may simply represent tiny phleboliths. Probable
bone island in the proximal right femur. No other acute
abnormalities.
IMPRESSION: No cause for the patient's symptoms identified.

## 2021-12-28 IMAGING — CT CT ABD-PELV W/ CM
2 of 4 series · 16 of 46 positions shown, 18 images · IV contrast (APPLIED)
Comparison: None.

CLINICAL DATA: Lower abdominal pain for 3 days.

EXAM:
CT ABDOMEN AND PELVIS WITH CONTRAST
TECHNIQUE: Multidetector CT imaging of the abdomen and pelvis was performed
using the standard protocol following bolus administration of
intravenous contrast.
CONTRAST:  100mL OMNIPAQUE IOHEXOL 300 MG/ML  SOLN

[Series 2: routine abd/pel with · axial · 0.78mm/px · z∈[-676,-226]mm · 13 of 98 slices shown, 15 images]
[im 4/98  soft-tissue]
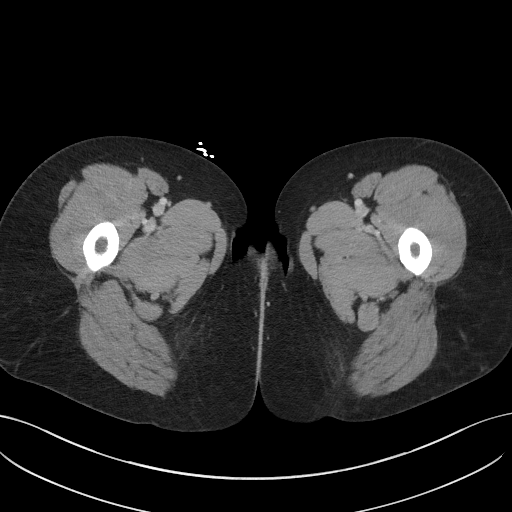
[im 4/98  bone]
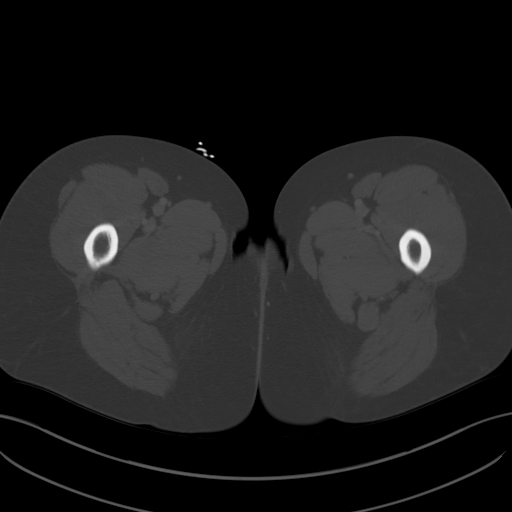
[im 12/98  soft-tissue]
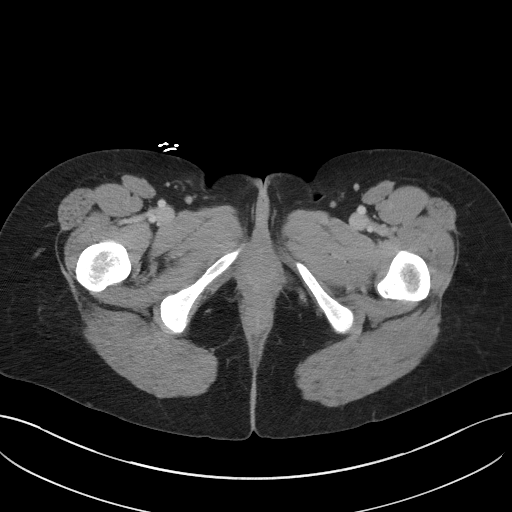
[im 20/98  soft-tissue]
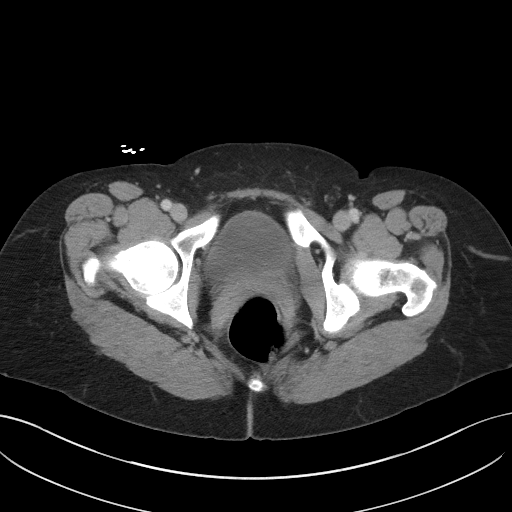
[im 28/98  soft-tissue]
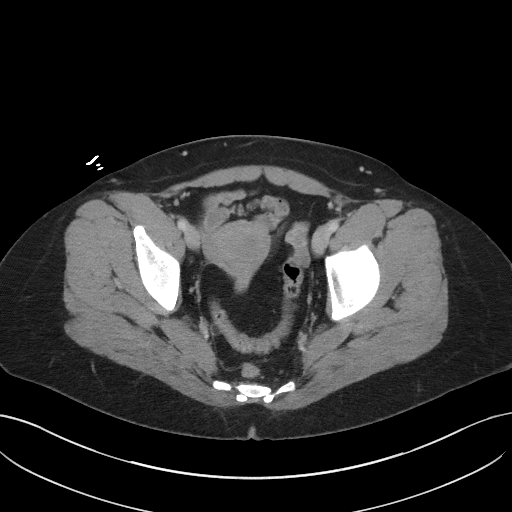
[im 35/98  soft-tissue]
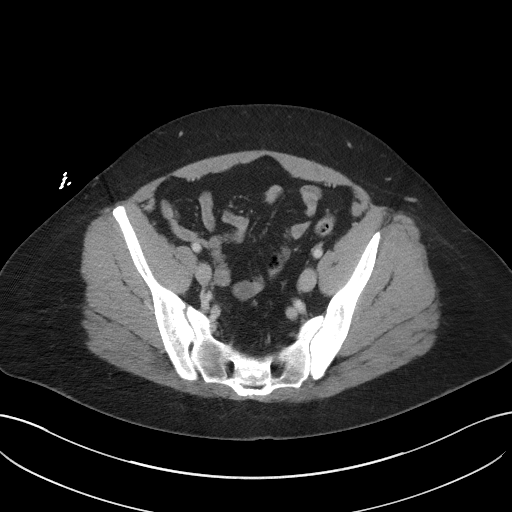
[im 43/98  soft-tissue]
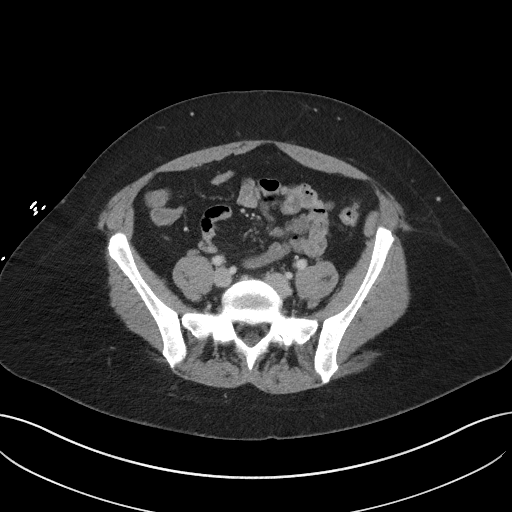
[im 51/98  soft-tissue]
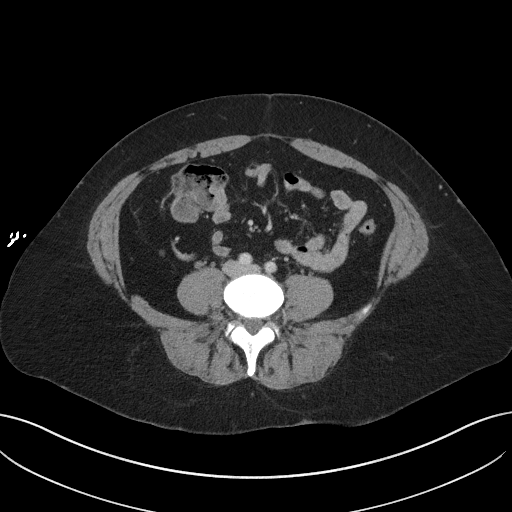
[im 55/98  soft-tissue]
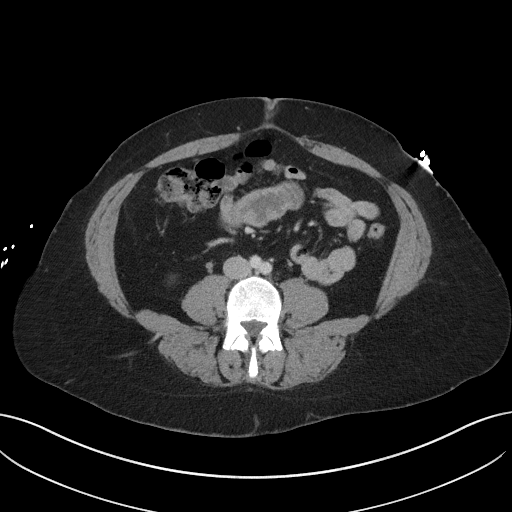
[im 63/98  soft-tissue]
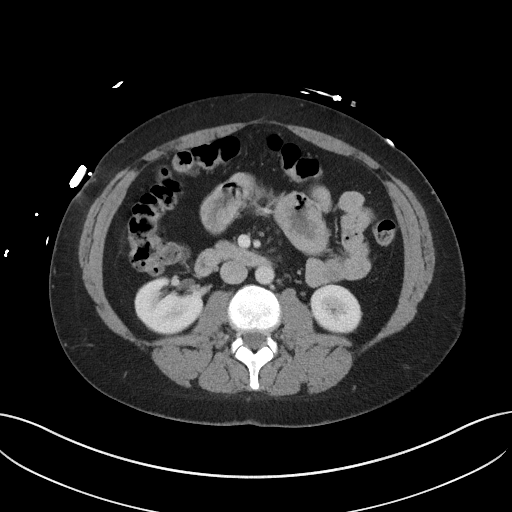
[im 63/98  bone]
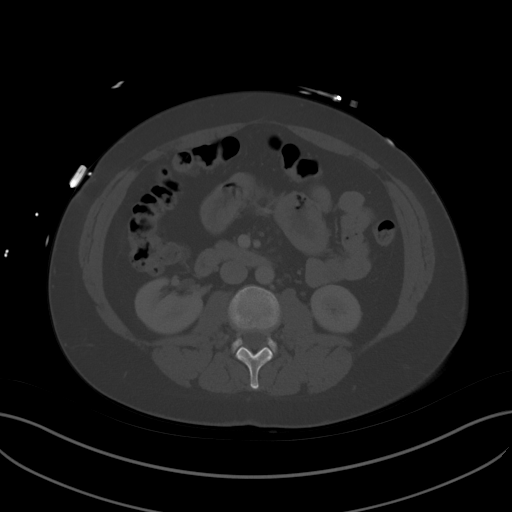
[im 70/98  soft-tissue]
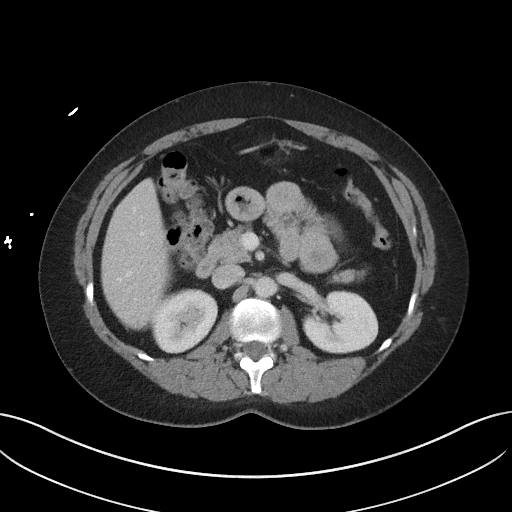
[im 78/98  soft-tissue]
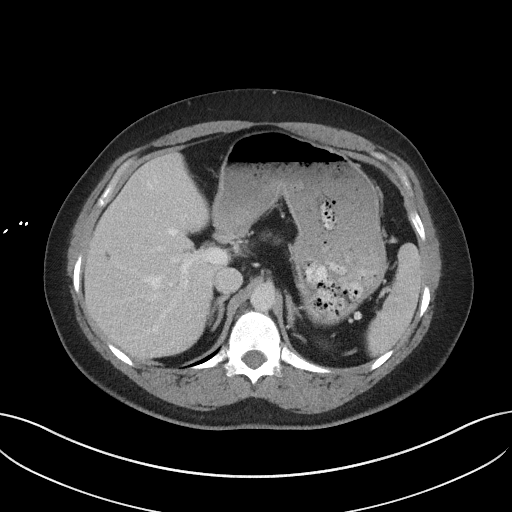
[im 86/98  soft-tissue]
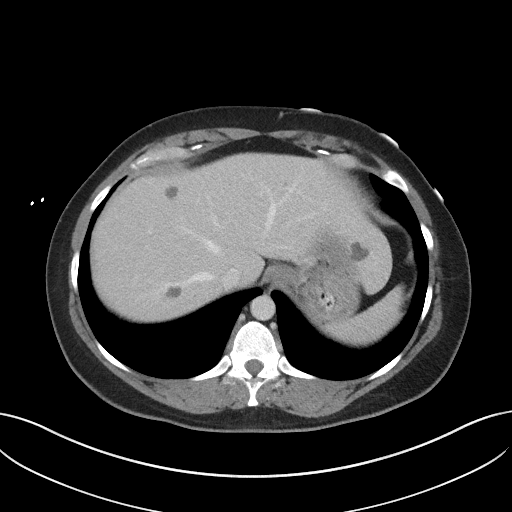
[im 94/98  soft-tissue]
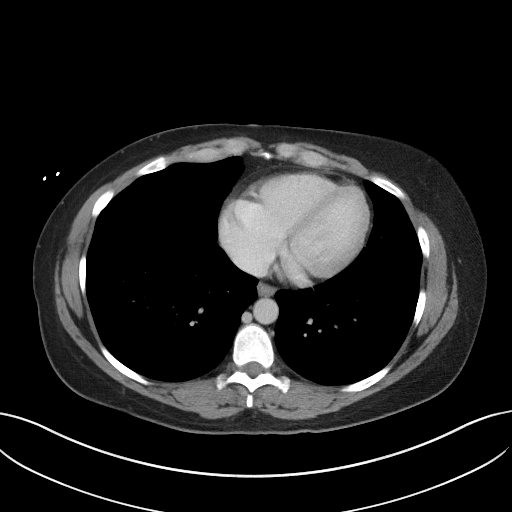

[Series 5: coronal st · coronal · 0.73mm/px · 3 of 95 slices shown]
[im 32/95  soft-tissue]
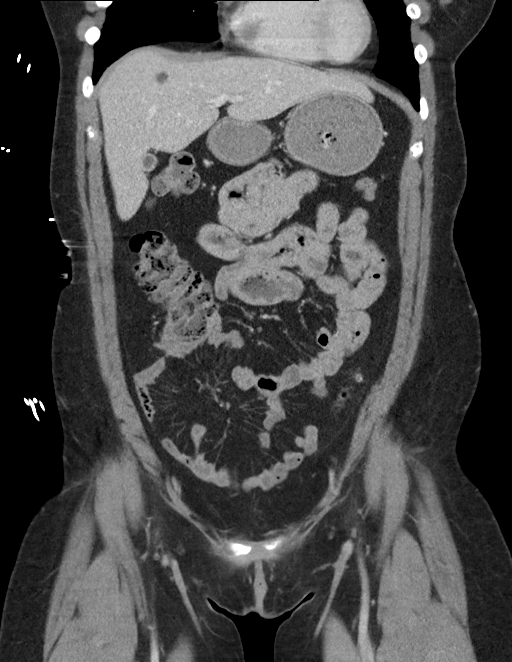
[im 42/95  soft-tissue]
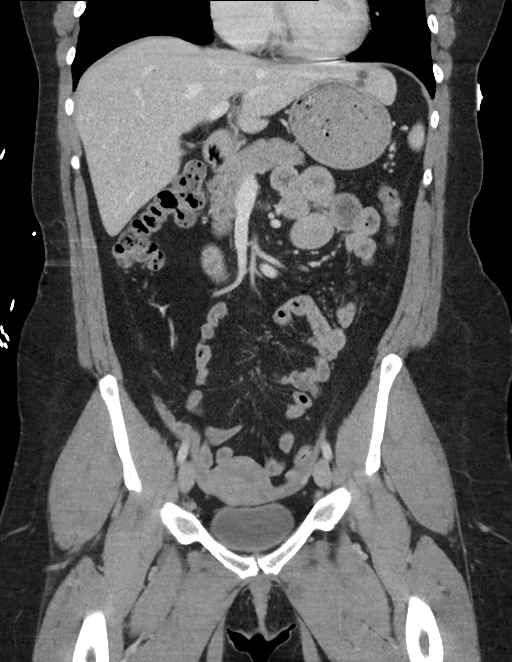
[im 53/95  soft-tissue]
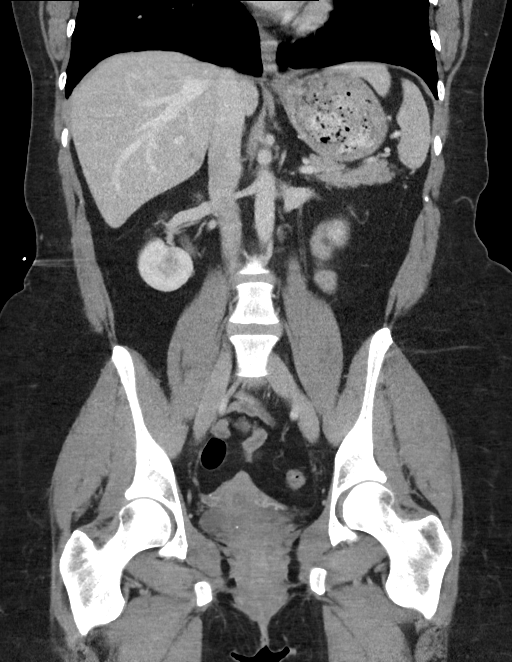

[16 of 46 positions shown; findings below may reference images not displayed]

FINDINGS: Lower Chest: No acute findings.

Hepatobiliary: Several small hepatic cysts are noted. No hepatic
masses identified. Gallbladder is unremarkable. No evidence of
biliary ductal dilatation.

Pancreas:  No mass or inflammatory changes.

Spleen: Within normal limits in size and appearance.

Adrenals/Urinary Tract: No masses identified. No evidence of
ureteral calculi or hydronephrosis.

Stomach/Bowel: No evidence of obstruction, inflammatory process or
abnormal fluid collections. Normal appendix visualized.

Vascular/Lymphatic: No pathologically enlarged lymph nodes. No acute
vascular findings.

Reproductive: Normal appearance of uterus. 2.1 cm left ovarian
follicle incidentally noted. No evidence of pelvic mass,
inflammatory process, or abnormal fluid collections.

Other:  None.

Musculoskeletal:  No suspicious bone lesions identified.
IMPRESSION: No acute findings or other significant abnormality within the
abdomen or pelvis.

## 2023-09-13 ENCOUNTER — Ambulatory Visit (INDEPENDENT_AMBULATORY_CARE_PROVIDER_SITE_OTHER): Payer: Self-pay | Admitting: Obstetrics

## 2023-09-13 ENCOUNTER — Encounter: Payer: Self-pay | Admitting: Obstetrics

## 2023-09-13 VITALS — BP 112/78 | HR 75 | Ht 66.0 in | Wt 179.0 lb

## 2023-09-13 DIAGNOSIS — Z0000 Encounter for general adult medical examination without abnormal findings: Secondary | ICD-10-CM

## 2023-09-13 DIAGNOSIS — Z01411 Encounter for gynecological examination (general) (routine) with abnormal findings: Secondary | ICD-10-CM

## 2023-09-13 DIAGNOSIS — N914 Secondary oligomenorrhea: Secondary | ICD-10-CM

## 2023-09-13 DIAGNOSIS — Z1231 Encounter for screening mammogram for malignant neoplasm of breast: Secondary | ICD-10-CM

## 2023-09-13 MED ORDER — MEDROXYPROGESTERONE ACETATE 10 MG PO TABS: 10.0000 mg | ORAL_TABLET | Freq: Every day | ORAL | 0 refills | Status: AC

## 2023-09-13 NOTE — Progress Notes (Signed)
 ANNUAL GYNECOLOGICAL EXAM  SUBJECTIVE  HPI  Brooke Wright is a 43 y.o.-year-old [id number] who presents for an annual gynecological exam today.  She denies pelvic pain, dyspareunia, abnormal vaginal discharge, and UTI symptoms. She reports that for the past 5 years, she has had very irregular periods. She will get a period about every 8-9 months, and it is very heavy and painful. She is also experiencing hair loss and hot flashes. She denies a h/o PCOS or fibroids. Her TSH is normal. She has been taking Remeron and Celexa for about 20 years. She has a h/o a blood clot from COCs when she was in her late teens.  Medical/Surgical History History reviewed. No pertinent past medical history. Past Surgical History:  Procedure Laterality Date   PERIPHERAL VASCULAR THROMBECTOMY     TONSILLECTOMY      Social History Lives with husband and children. Feels safe there Work: Psychologist, counselling Exercise: Activities at work, swimming Substances: Occasional EtOH, smokes ~4 cigs/day, +MJ  Obstetric History OB History     Gravida  2   Para  2   Term  1   Preterm  1   AB      Living  2      SAB      IAB      Ectopic      Multiple      Live Births  2            GYN/Menstrual History Patient's last menstrual period was 08/31/2023 (within weeks). Irregular periods Last Pap: 12/2019. NILM, negative HPV Contraception: vasectomy  Prevention Dentist: no Eye exam: regular visits Mammogram: not yet started Colonoscopy: at 45  Current Medications Outpatient Medications Prior to Visit  Medication Sig   ALPRAZolam (XANAX) 0.25 MG tablet Take 0.25 mg by mouth 2 (two) times daily as needed for anxiety.   Calcium Carb-Cholecalciferol (CALCIUM CARBONATE-VITAMIN D3) 600-400 MG-UNIT TABS Take 2 tablets by mouth daily.   citalopram (CELEXA) 20 MG tablet Take 20 mg by mouth daily.   hydrOXYzine  (ATARAX ) 10 MG tablet Start 1 tab q4-6 hr prn. canincrease to 2 or 3 tabs at a time  if needed   ibuprofen  (ADVIL ) 400 MG tablet Take 1 tablet (400 mg total) by mouth every 6 (six) hours as needed.   mirtazapine (REMERON) 30 MG tablet Take 30 mg by mouth at bedtime.   B Complex Vitamins (VITAMIN B COMPLEX 100 IJ) Take by mouth.   dicyclomine  (BENTYL ) 10 MG capsule Take 1 capsule (10 mg total) by mouth 3 (three) times daily as needed for up to 5 days (abdominal pain/cramping). (Patient not taking: Reported on 09/13/2023)   diphenhydrAMINE  (BENADRYL ) 25 MG tablet Take 1 tablet (25 mg total) by mouth in the morning, at noon, and at bedtime for 3 days. (Patient not taking: Reported on 09/13/2023)   No facility-administered medications prior to visit.      [facility] - 09/13/23 0835       Pregnancy Intention Screening   Does the patient want to become pregnant in the next year? No    Does the patient's partner want to become pregnant in the next year? No    Would the patient like to discuss contraceptive options today? No      Contraception Wrap Up   Current Method --   husband has vasectomy   Contraception Counseling Provided No    How was the end contraceptive method provided? N/A  ROS Constitutional: Denied constitutional symptoms, night sweats, recent illness, fatigue, fever, insomnia and weight loss.  Eyes: Denied eye symptoms, eye pain, photophobia, vision change and visual disturbance.  Ears/Nose/Throat/Neck: Denied ear, nose, throat or neck symptoms, hearing loss, nasal discharge, sinus congestion and sore throat.  Cardiovascular: Denied cardiovascular symptoms, arrhythmia, chest pain/pressure, edema, exercise intolerance, orthopnea and palpitations.  Respiratory: Denied pulmonary symptoms, asthma, pleuritic pain, productive sputum, cough, dyspnea and wheezing.  Gastrointestinal: Denied gastro-esophageal reflux, melena, nausea and vomiting.  Genitourinary: See HPI  Musculoskeletal: Denied musculoskeletal symptoms, stiffness, swelling, muscle weakness and  myalgia.  Dermatologic: Denied dermatology symptoms, rash and scar.  Neurologic: Denied neurology symptoms, dizziness, headache, neck pain and syncope.  Psychiatric: Denied psychiatric symptoms, anxiety and depression.  Endocrine: Denied endocrine symptoms including hot flashes and night sweats.    OBJECTIVE  BP 112/78   Pulse 75   Ht 5' 6 (1.676 m)   Wt 179 lb (81.2 kg)   LMP 08/31/2023 (Within Weeks)   BMI 28.89 kg/m    Physical examination General NAD, Conversant  HEENT Atraumatic; Op clear with mmm.  Normo-cephalic. Pupils reactive. Anicteric sclerae  Thyroid/Neck Smooth without nodularity or enlargement. Normal ROM.  Neck Supple.  Skin No rashes, lesions or ulceration. Normal palpated skin turgor. No nodularity.  Breasts: No masses or discharge.  Symmetric.  No axillary adenopathy.  Lungs: Clear to auscultation.No rales or wheezes. Normal Respiratory effort, no retractions.  Heart: NSR.  No murmurs or rubs appreciated. No peripheral edema  Abdomen: Soft.  Non-tender.  No masses.  No HSM. No hernia  Extremities: Moves all appropriately.  Normal ROM for age. No lymphadenopathy.  Neuro: Oriented to PPT.  Normal mood. Normal affect.     Pelvic: Declined    ASSESSMENT  1) Annual exam 2) Oligomenorrhea and hot flashes  PLAN 1) Physical exam as noted. Discussed healthy lifestyle choices and preventive care. Declines STI testing. Routine labs done with PCP. Mammogram ordered. Pap due in 2026. 2) Discussed possible causes of oligomenorrhea including medications, early menopause, insulin resistance. Labs: FSH/LH, prolactin, AMH, A1C. Discussed need for endometrial protection with regular cycling if she is not menopausal. She is not a good candidate for COCs d/t age, smoking, and h/o blood clot. Will do progesterone challenge. Rx sent for Provera 10 mg x 10 days. F/u based on results.   Return in one year for annual exam and Pap or as needed for concerns.   Delois Silvester,  CNM

## 2023-09-19 ENCOUNTER — Ambulatory Visit: Payer: Self-pay | Admitting: Obstetrics

## 2023-09-19 ENCOUNTER — Other Ambulatory Visit: Payer: Self-pay | Admitting: Obstetrics

## 2023-09-19 DIAGNOSIS — N950 Postmenopausal bleeding: Secondary | ICD-10-CM

## 2023-09-19 LAB — HEMOGLOBIN A1C
Est. average glucose Bld gHb Est-mCnc: 111 mg/dL
Hgb A1c MFr Bld: 5.5 % (ref 4.8–5.6)

## 2023-09-19 LAB — ANTI MULLERIAN HORMONE: ANTI-MULLERIAN HORMONE (AMH): <0.015 ng/mL

## 2023-09-19 LAB — FSH/LH
FSH: 67.6 m[IU]/mL
LH: 44.3 m[IU]/mL

## 2023-09-19 LAB — PROLACTIN: Prolactin: 7.6 ng/mL (ref 4.8–33.4)

## 2023-10-01 ENCOUNTER — Encounter: Payer: Self-pay | Admitting: Obstetrics
